# Patient Record
Sex: Female | Born: 1980 | Race: White | Hispanic: No | Marital: Single | Smoking: Never smoker
Health system: Southern US, Community
[De-identification: ages and names within clinical notes are randomized; demographics above are authoritative.]

## PROBLEM LIST (undated history)

## (undated) DIAGNOSIS — F319 Bipolar disorder, unspecified: Secondary | ICD-10-CM

## (undated) DIAGNOSIS — F419 Anxiety disorder, unspecified: Secondary | ICD-10-CM

## (undated) DIAGNOSIS — G629 Polyneuropathy, unspecified: Secondary | ICD-10-CM

## (undated) HISTORY — PX: TUBAL LIGATION: SHX77

## (undated) HISTORY — PX: ABLATION: SHX5711

---

## 2019-09-30 ENCOUNTER — Ambulatory Visit: Payer: Self-pay | Attending: Internal Medicine

## 2019-10-14 ENCOUNTER — Emergency Department (HOSPITAL_COMMUNITY): Payer: Medicaid - Out of State

## 2019-10-14 ENCOUNTER — Encounter (HOSPITAL_COMMUNITY): Payer: Self-pay | Admitting: Emergency Medicine

## 2019-10-14 ENCOUNTER — Other Ambulatory Visit: Payer: Self-pay

## 2019-10-14 ENCOUNTER — Emergency Department (HOSPITAL_COMMUNITY)
Admission: EM | Admit: 2019-10-14 | Discharge: 2019-10-14 | Disposition: A | Discharge status: Home / Self Care | Payer: Medicaid - Out of State | Attending: Emergency Medicine | Admitting: Emergency Medicine

## 2019-10-14 DIAGNOSIS — U071 COVID-19: Secondary | ICD-10-CM | POA: Diagnosis not present

## 2019-10-14 DIAGNOSIS — R110 Nausea: Secondary | ICD-10-CM | POA: Diagnosis not present

## 2019-10-14 DIAGNOSIS — R079 Chest pain, unspecified: Secondary | ICD-10-CM | POA: Diagnosis not present

## 2019-10-14 DIAGNOSIS — M791 Myalgia, unspecified site: Secondary | ICD-10-CM | POA: Diagnosis present

## 2019-10-14 HISTORY — DX: Bipolar disorder, unspecified: F31.9

## 2019-10-14 HISTORY — DX: Polyneuropathy, unspecified: G62.9

## 2019-10-14 HISTORY — DX: Anxiety disorder, unspecified: F41.9

## 2019-10-14 LAB — CBC WITH DIFFERENTIAL/PLATELET
Abs Immature Granulocytes: 0.03 10*3/uL (ref 0.00–0.07)
Basophils Absolute: 0 10*3/uL (ref 0.0–0.1)
Basophils Relative: 0 %
Eosinophils Absolute: 0.1 10*3/uL (ref 0.0–0.5)
Eosinophils Relative: 1 %
HCT: 43.2 % (ref 36.0–46.0)
Hemoglobin: 14.1 g/dL (ref 12.0–15.0)
Immature Granulocytes: 0 %
Lymphocytes Relative: 25 %
Lymphs Abs: 2.1 10*3/uL (ref 0.7–4.0)
MCH: 29.3 pg (ref 26.0–34.0)
MCHC: 32.6 g/dL (ref 30.0–36.0)
MCV: 89.8 fL (ref 80.0–100.0)
Monocytes Absolute: 0.5 10*3/uL (ref 0.1–1.0)
Monocytes Relative: 6 %
Neutro Abs: 5.7 10*3/uL (ref 1.7–7.7)
Neutrophils Relative %: 68 %
Platelets: 219 10*3/uL (ref 150–400)
RBC: 4.81 MIL/uL (ref 3.87–5.11)
RDW: 12.3 % (ref 11.5–15.5)
WBC: 8.4 10*3/uL (ref 4.0–10.5)
nRBC: 0 % (ref 0.0–0.2)

## 2019-10-14 LAB — COMPREHENSIVE METABOLIC PANEL
ALT: 16 U/L (ref 0–44)
AST: 19 U/L (ref 15–41)
Albumin: 3.5 g/dL (ref 3.5–5.0)
Alkaline Phosphatase: 70 U/L (ref 38–126)
Anion gap: 7 (ref 5–15)
BUN: 8 mg/dL (ref 6–20)
CO2: 25 mmol/L (ref 22–32)
Calcium: 8.5 mg/dL — ABNORMAL LOW (ref 8.9–10.3)
Chloride: 105 mmol/L (ref 98–111)
Creatinine, Ser: 0.43 mg/dL — ABNORMAL LOW (ref 0.44–1.00)
GFR calc Af Amer: >60 mL/min (ref >60)
GFR calc non Af Amer: >60 mL/min (ref >60)
Glucose, Bld: 97 mg/dL (ref 70–99)
Potassium: 3.8 mmol/L (ref 3.5–5.1)
Sodium: 137 mmol/L (ref 135–145)
Total Bilirubin: 0.4 mg/dL (ref 0.3–1.2)
Total Protein: 6.7 g/dL (ref 6.5–8.1)

## 2019-10-14 LAB — URINALYSIS, ROUTINE W REFLEX MICROSCOPIC
Bilirubin Urine: NEGATIVE
Glucose, UA: NEGATIVE mg/dL
Ketones, ur: NEGATIVE mg/dL
Nitrite: NEGATIVE
Protein, ur: NEGATIVE mg/dL
Specific Gravity, Urine: 1.012 (ref 1.005–1.030)
pH: 7 (ref 5.0–8.0)

## 2019-10-14 LAB — PREGNANCY, URINE: Preg Test, Ur: NEGATIVE

## 2019-10-14 LAB — TROPONIN I (HIGH SENSITIVITY): Troponin I (High Sensitivity): <2 ng/L (ref <18)

## 2019-10-14 LAB — HEPATITIS PANEL, ACUTE
HCV Ab: NONREACTIVE
Hep A IgM: NONREACTIVE
Hep B C IgM: NONREACTIVE
Hepatitis B Surface Ag: NONREACTIVE

## 2019-10-14 LAB — LIPASE, BLOOD: Lipase: 25 U/L (ref 11–51)

## 2019-10-14 LAB — HIV ANTIBODY (ROUTINE TESTING W REFLEX): HIV Screen 4th Generation wRfx: NONREACTIVE

## 2019-10-14 MED ORDER — ONDANSETRON HCL 4 MG PO TABS: 4.0000 mg | ORAL_TABLET | Freq: Three times a day (TID) | ORAL | 0 refills | Status: AC | PRN

## 2019-10-14 MED ORDER — PANTOPRAZOLE SODIUM 20 MG PO TBEC
20.0000 mg | DELAYED_RELEASE_TABLET | Freq: Every day | ORAL | 0 refills | Status: AC
Start: 2019-10-14 — End: ?

## 2019-10-14 MED ORDER — SODIUM CHLORIDE 0.9 % IV BOLUS
1000.0000 mL | Freq: Once | INTRAVENOUS | Status: AC
  Administered 2019-10-14: 1000 mL via INTRAVENOUS

## 2019-10-14 MED ORDER — ONDANSETRON HCL 4 MG/2ML IJ SOLN
4.0000 mg | Freq: Once | INTRAMUSCULAR | Status: AC
  Administered 2019-10-14: 4 mg via INTRAVENOUS
  Filled 2019-10-14: qty 2

## 2019-10-14 NOTE — ED Triage Notes (Addendum)
Pt reports was diagnosed with covid and oral thrush 5 days ago. Pt reports nausea, generalized weakness,body aches, emesis, intermittent chest pain. Pt reports had covid in January of 2021 as well. EDP at bedside during triage.

## 2019-10-14 NOTE — Discharge Instructions (Signed)
You were seen in the emergency department for worsening nausea chest pain and malaise in the setting of a new Covid diagnosis.  Your pulse ox was 100% here.  You had lab work that was unremarkable along with an EKG. your chest x-ray showed possible signs of pneumonia in the left base.  This could be due to Covid.  Please follow-up with your primary care doctor as scheduled.  We are prescribing you some nausea medication and acid medication.  Return the emergency department for any worsening or concerning symptoms.  At the time of discharge there are still labs pending which you can follow in my chart and review with your doctor.

## 2019-10-14 NOTE — ED Provider Notes (Signed)
[facility] EMERGENCY DEPARTMENT Provider Note   CSN: [id number] Arrival date & time: 10/14/19  1429     History Chief Complaint  Patient presents with  . Generalized Body Aches    Erica Klein is a 39 y.o. female.  She is here with complaint of being sick for 3 weeks.  She said she had Covid in January.  She ultimately went to an urgent care and they diagnosed her with thrush and Covid.  Using nystatin swish and swallows.  Complaining of severe nausea, generalized weakness, body aches, intermittent chest pain.  Minimal cough.  No diarrhea.  No prior history of thrush.  No recent antibiotics.  The history is provided by the patient.  Chest Pain Pain location:  Substernal area Pain quality: aching   Pain severity:  Moderate Onset quality:  Gradual Timing:  Intermittent Progression:  Worsening Chronicity:  New Relieved by:  None tried Worsened by:  Nothing Ineffective treatments:  None tried Associated symptoms: anorexia, cough, dysphagia, fatigue, headache, nausea, vomiting and weakness   Associated symptoms: no abdominal pain, no diaphoresis, no fever, no near-syncope, no numbness, no shortness of breath and no syncope   Risk factors: no coronary artery disease, no diabetes mellitus, no high cholesterol, no hypertension and no smoking        Past Medical History:  Diagnosis Date  . Anxiety   . Bipolar 1 disorder (HCC)   . Neuropathy     There are no problems to display for this patient.   Past Surgical History:  Procedure Laterality Date  . TUBAL LIGATION       OB History   No obstetric history on file.     History reviewed. No pertinent family history.  Social History   Tobacco Use  . Smoking status: Never Smoker  . Smokeless tobacco: Never Used  Substance Use Topics  . Alcohol use: Yes    Comment: occ  . Drug use: Yes    Types: Marijuana    Comment: occ    Home Medications Prior to Admission medications   Not on File    Allergies      Keflex [cephalexin] and Penicillins  Review of Systems   Review of Systems  Constitutional: Positive for fatigue. Negative for diaphoresis and fever.  HENT: Positive for trouble swallowing. Negative for sore throat.   Eyes: Negative for visual disturbance.  Respiratory: Positive for cough. Negative for shortness of breath.   Cardiovascular: Positive for chest pain. Negative for syncope and near-syncope.  Gastrointestinal: Positive for anorexia, nausea and vomiting. Negative for abdominal pain.  Genitourinary: Negative for dysuria.  Musculoskeletal: Positive for myalgias.  Skin: Negative for rash.  Neurological: Positive for weakness and headaches. Negative for numbness.    Physical Exam Updated Vital Signs BP (!) 154/88 (BP Location: Left Arm)   Pulse 90   Resp 18   Ht 5\' 10"  (1.778 m)   Wt 88.5 kg   LMP  (LMP Unknown) Comment: irregular periods  SpO2 99%   BMI 27.98 kg/m   Physical Exam Vitals and nursing note reviewed.  Constitutional:      General: She is not in acute distress.    Appearance: She is well-developed.  HENT:     Head: Normocephalic and atraumatic.     Mouth/Throat:     Mouth: Mucous membranes are dry.     Pharynx: No oropharyngeal exudate.     Comments: She has somewhat of a beefy red tongue that is dry in appearance.  Not  see any pharyngeal plaques. Eyes:     Conjunctiva/sclera: Conjunctivae normal.  Cardiovascular:     Rate and Rhythm: Normal rate and regular rhythm.     Pulses: Normal pulses.     Heart sounds: No murmur heard.   Pulmonary:     Effort: Pulmonary effort is normal. No respiratory distress.     Breath sounds: Normal breath sounds.  Abdominal:     Palpations: Abdomen is soft.     Tenderness: There is no abdominal tenderness. There is no guarding or rebound.  Musculoskeletal:        General: No deformity or signs of injury. Normal range of motion.     Cervical back: Neck supple.  Skin:    General: Skin is warm and dry.      Capillary Refill: Capillary refill takes less than 2 seconds.  Neurological:     General: No focal deficit present.     Mental Status: She is alert.     Gait: Gait normal.     ED Results / Procedures / Treatments   Labs (all labs ordered are listed, but only abnormal results are displayed) Labs Reviewed  URINALYSIS, ROUTINE W REFLEX MICROSCOPIC - Abnormal; Notable for the following components:      Result Value   APPearance HAZY (*)    Hgb urine dipstick MODERATE (*)    Leukocytes,Ua MODERATE (*)    Bacteria, UA RARE (*)    All other components within normal limits  COMPREHENSIVE METABOLIC PANEL - Abnormal; Notable for the following components:   Creatinine, Ser 0.43 (*)    Calcium 8.5 (*)    All other components within normal limits  CULTURE, BLOOD (ROUTINE X 2)  CULTURE, BLOOD (ROUTINE X 2)  URINE CULTURE  PREGNANCY, URINE  CBC WITH DIFFERENTIAL/PLATELET  LIPASE, BLOOD  HIV ANTIBODY (ROUTINE TESTING W REFLEX)  HEPATITIS PANEL, ACUTE  TROPONIN I (HIGH SENSITIVITY)    EKG EKG Interpretation  Date/Time:  Wednesday October 14 2019 16:38:01 EDT Ventricular Rate:  76 PR Interval:    QRS Duration: 88 QT Interval:  412 QTC Calculation: 464 R Axis:   76 Text Interpretation: Sinus rhythm No old tracing to compare Confirmed by Meridee Score [phone]) on 10/14/2019 4:54:56 PM   Radiology DG Chest Port 1 View  Result Date: 10/14/2019 CLINICAL DATA:  Chest pain.  COVID 5 days ago. EXAM: PORTABLE CHEST 1 VIEW COMPARISON:  None. FINDINGS: Mild hyperinflation and central bronchial thickening. The heart is normal in size. Normal mediastinal contours. Minimal subsegmental opacities at the left lung base. No confluent airspace disease. Mild biapical pleuroparenchymal scarring. No pneumothorax or large pleural effusion. No acute osseous abnormalities are seen. IMPRESSION: Mild hyperinflation and central bronchial thickening, can be seen with asthma or bronchitis. Minimal subsegmental  opacities at the left lung base, atelectasis versus pneumonia in the setting of COVID infection. Electronically Signed   By: Narda Rutherford M.D.   On: 10/14/2019 16:12    Procedures Procedures (including critical care time)  Medications Ordered in ED Medications  sodium chloride 0.9 % bolus 1,000 mL (0 mLs Intravenous Stopped 10/14/19 1902)  ondansetron (ZOFRAN) injection 4 mg (4 mg Intravenous Given 10/14/19 1632)    ED Course  I have reviewed the triage vital signs and the nursing notes.  Pertinent labs & imaging results that were available during my care of the patient were reviewed by me and considered in my medical decision making (see chart for details).  Clinical Course as of Oct 15 1051  Wed Oct 14, 2019  1558 Chest x-ray interpreted by me as flattened diaphragms no gross infiltrates.  Awaiting radiology reading.   [MB]  1731 Reevaluated patient, she says her nausea is much improved.   [MB]  1818 Patient's lab work is been fairly unremarkable.  I do not think she needs a second troponin because her symptoms are very atypical and her troponin is undetectable.  I reviewed all this with her.  She is comfortable with the plan for following up with her primary care doctor.  I will prescribe her some nausea medicine and a PPI.  Return instructions discussed.   [MB]    Clinical Course User Index [MB] Terrilee Files, MD   MDM Rules/Calculators/A&P                         HANAE WAITERS was evaluated in Emergency Department on 10/14/2019 for the symptoms described in the history of present illness. She was evaluated in the context of the global COVID-19 pandemic, which necessitated consideration that the patient might be at risk for infection with the SARS-CoV-2 virus that causes COVID-19. Institutional protocols and algorithms that pertain to the evaluation of patients at risk for COVID-19 are in a state of rapid change based on information released by regulatory bodies including the  CDC and federal and state organizations. These policies and algorithms were followed during the patient's care in the ED.  This patient complains of weakness nausea chest pain Covid; this involves an extensive number of treatment Options and is a complaint that carries with it a high risk of complications and Morbidity. The differential includes Covid, pneumonia, dehydration, infection, metabolic derangement  I ordered, reviewed and interpreted labs, which included CBC with normal white count normal hemoglobin, chemistries normal other than mildly low calcium of 8.5 urinalysis without overt signs of infection, troponin negative, pregnancy test negativeChemistries with elevated glucose, normal LFTs. Have ordered HIV and hepatitis testing and these are pending at time of discharge I ordered medication IV fluids and nausea medication with improvement in her symptoms I ordered imaging studies which included chest x-ray and I independently    visualized and interpreted imaging which showed possible atelectasis versus infiltrate left base Previous records obtained and reviewed in epic, none  After the interventions stated above, I reevaluated the patient and found her to be hemodynamically stable satting 99% on room air. Symptoms are improved. Reviewed work-up with her. She is comfortable with discharge and following up with her PCP. Will write her a prescription for Zofran. Return instructions discussed   Final Clinical Impression(s) / ED Diagnoses Final diagnoses:  COVID-19 virus infection  Nausea  Nonspecific chest pain    Rx / DC Orders ED Discharge Orders         Ordered    ondansetron (ZOFRAN) 4 MG tablet  Every 8 hours PRN     Discontinue  Reprint     10/14/19 1819    pantoprazole (PROTONIX) 20 MG tablet  Daily     Discontinue  Reprint     10/14/19 1819           Terrilee Files, MD 10/15/19 1056

## 2019-10-15 LAB — URINE CULTURE: Culture: <10000 — AB

## 2019-10-19 LAB — CULTURE, BLOOD (ROUTINE X 2)
Culture: NO GROWTH
Culture: NO GROWTH
Special Requests: ADEQUATE
Special Requests: ADEQUATE

## 2020-04-08 ENCOUNTER — Emergency Department (HOSPITAL_COMMUNITY)
Admission: EM | Admit: 2020-04-08 | Discharge: 2020-04-09 | Disposition: A | Discharge status: Home / Self Care | Payer: Medicaid - Out of State | Attending: Emergency Medicine | Admitting: Emergency Medicine

## 2020-04-08 ENCOUNTER — Other Ambulatory Visit: Payer: Self-pay

## 2020-04-08 ENCOUNTER — Encounter (HOSPITAL_COMMUNITY): Payer: Self-pay | Admitting: Emergency Medicine

## 2020-04-08 DIAGNOSIS — M79605 Pain in left leg: Secondary | ICD-10-CM | POA: Diagnosis not present

## 2020-04-08 LAB — CBC WITH DIFFERENTIAL/PLATELET
Abs Immature Granulocytes: 0.03 10*3/uL (ref 0.00–0.07)
Basophils Absolute: 0 10*3/uL (ref 0.0–0.1)
Basophils Relative: 0 %
Eosinophils Absolute: 0.1 10*3/uL (ref 0.0–0.5)
Eosinophils Relative: 2 %
HCT: 37.5 % (ref 36.0–46.0)
Hemoglobin: 12 g/dL (ref 12.0–15.0)
Immature Granulocytes: 0 %
Lymphocytes Relative: 27 %
Lymphs Abs: 2.3 10*3/uL (ref 0.7–4.0)
MCH: 29.3 pg (ref 26.0–34.0)
MCHC: 32 g/dL (ref 30.0–36.0)
MCV: 91.7 fL (ref 80.0–100.0)
Monocytes Absolute: 0.5 10*3/uL (ref 0.1–1.0)
Monocytes Relative: 6 %
Neutro Abs: 5.5 10*3/uL (ref 1.7–7.7)
Neutrophils Relative %: 65 %
Platelets: 202 10*3/uL (ref 150–400)
RBC: 4.09 MIL/uL (ref 3.87–5.11)
RDW: 12.5 % (ref 11.5–15.5)
WBC: 8.5 10*3/uL (ref 4.0–10.5)
nRBC: 0 % (ref 0.0–0.2)

## 2020-04-08 LAB — BASIC METABOLIC PANEL
Anion gap: 10 (ref 5–15)
BUN: 9 mg/dL (ref 6–20)
CO2: 21 mmol/L — ABNORMAL LOW (ref 22–32)
Calcium: 8.7 mg/dL — ABNORMAL LOW (ref 8.9–10.3)
Chloride: 105 mmol/L (ref 98–111)
Creatinine, Ser: 0.56 mg/dL (ref 0.44–1.00)
GFR, Estimated: >60 mL/min (ref >60)
Glucose, Bld: 92 mg/dL (ref 70–99)
Potassium: 3.4 mmol/L — ABNORMAL LOW (ref 3.5–5.1)
Sodium: 136 mmol/L (ref 135–145)

## 2020-04-08 LAB — PREGNANCY, URINE: Preg Test, Ur: NEGATIVE

## 2020-04-08 MED ORDER — ENOXAPARIN SODIUM 100 MG/ML ~~LOC~~ SOLN
1.0000 mg/kg | Freq: Once | SUBCUTANEOUS | Status: AC
  Administered 2020-04-09: 90 mg via SUBCUTANEOUS
  Filled 2020-04-08: qty 1

## 2020-04-08 NOTE — ED Triage Notes (Signed)
Patient reports pain in LT calf that started a couple of hours ago, worried it may be a blood clot.

## 2020-04-08 NOTE — Discharge Instructions (Signed)
Please return for lower extremity ultrasound tomorrow morning at 9 AM. Show up no later than 8:45 AM for registration.

## 2020-04-08 NOTE — ED Notes (Signed)
Pt. Had a steady gait to room.

## 2020-04-08 NOTE — ED Provider Notes (Signed)
[facility] EMERGENCY DEPARTMENT Provider Note   CSN: [id number] Arrival date & time: 04/08/20  2104     History Chief Complaint  Patient presents with  . Leg Pain    Erica Klein is a 39 y.o. female who presents to the ED today with complaint of gradual onset, constant, throbbing, left calf pain that began approximately 2 hours ago. Pt reports that multiple individual on her maternal side of had blood clots - she is unsure if there is a blood disorder in the family prompting them. She states that her boyfriend also noticed that her leg was more swollen to him and she agrees it does look slightly more swollen. She has not taken anything for pain. Denies chest pain or SOB. No hx DVT/PE personally for patient. No recent trauma to the leg. No recent surgery or prolonged immobilization. Pt is not on any OCPs. Pt denies chance of pregnancy.  The history is provided by the patient and medical records.       Past Medical History:  Diagnosis Date  . Anxiety   . Bipolar 1 disorder (HCC)   . Neuropathy     There are no problems to display for this patient.   Past Surgical History:  Procedure Laterality Date  . TUBAL LIGATION       OB History   No obstetric history on file.     No family history on file.  Social History   Tobacco Use  . Smoking status: Never Smoker  . Smokeless tobacco: Never Used  Substance Use Topics  . Alcohol use: Yes    Comment: occ  . Drug use: Yes    Types: Marijuana    Comment: occ    Home Medications Prior to Admission medications   Medication Sig Start Date End Date Taking? Authorizing Provider  albuterol (VENTOLIN HFA) 108 (90 Base) MCG/ACT inhaler Inhale 2 puffs into the lungs every 4 (four) hours as needed for wheezing or shortness of breath.  04/08/19   [provider]  clonazePAM (KLONOPIN) 0.5 MG tablet Take 0.5 mg by mouth daily as needed. 07/23/19   [provider]  gabapentin (NEURONTIN) 300 MG capsule Take  300 mg by mouth 3 (three) times daily. 09/28/19   [provider]  nystatin (MYCOSTATIN) 100000 UNIT/ML suspension Take 1 mL by mouth 2 (two) times daily. 10/09/19   [provider]  ondansetron (ZOFRAN) 4 MG tablet Take 1 tablet (4 mg total) by mouth every 8 (eight) hours as needed for nausea or vomiting. 10/14/19   Terrilee Files, MD  pantoprazole (PROTONIX) 20 MG tablet Take 1 tablet (20 mg total) by mouth daily. 10/14/19   Terrilee Files, MD  VRAYLAR capsule Take 1.5 mg by mouth daily. 09/26/19   [provider]    Allergies    Keflex [cephalexin] and Penicillins  Review of Systems   Review of Systems  Constitutional: Negative for chills and fever.  Respiratory: Negative for shortness of breath.   Cardiovascular: Positive for leg swelling. Negative for chest pain.  Musculoskeletal: Positive for arthralgias (left calf pain).    Physical Exam Updated Vital Signs BP 131/80 (BP Location: Right Arm)   Temp 98.3 F (36.8 C) (Oral)   Resp 18   Ht 5\' 11"  (1.803 m)   Wt 90.7 kg   SpO2 100%   BMI 27.89 kg/m   Physical Exam Vitals and nursing note reviewed.  Constitutional:      Appearance: She is not ill-appearing.  HENT:     Head: Normocephalic and atraumatic.  Eyes:     Conjunctiva/sclera: Conjunctivae normal.  Cardiovascular:     Rate and Rhythm: Normal rate and regular rhythm.     Pulses: Normal pulses.  Pulmonary:     Effort: Pulmonary effort is normal.     Breath sounds: Normal breath sounds. No wheezing, rhonchi or rales.  Musculoskeletal:     Comments: Mild swelling noted to LLE compared to RLE with TTP along the posterior calf and popliteal area. No baker's cyst appreciated. No erythema. 2+ DP pulse. ROM intact to LLE. Strength and sensation intact.   Skin:    General: Skin is warm and dry.     Coloration: Skin is not jaundiced.  Neurological:     Mental Status: She is alert.     ED Results / Procedures / Treatments   Labs (all labs  ordered are listed, but only abnormal results are displayed) Labs Reviewed  BASIC METABOLIC PANEL - Abnormal; Notable for the following components:      Result Value   Potassium 3.4 (*)    CO2 21 (*)    Calcium 8.7 (*)    All other components within normal limits  CBC WITH DIFFERENTIAL/PLATELET  PREGNANCY, URINE    EKG None  Radiology No results found.  Procedures Procedures (including critical care time)  Medications Ordered in ED Medications - No data to display  ED Course  I have reviewed the triage vital signs and the nursing notes.  Pertinent labs & imaging results that were available during my care of the patient were reviewed by me and considered in my medical decision making (see chart for details).    MDM Rules/Calculators/A&P                          39 year old female presents the ED today with complaint of left lower leg swelling and pain to her calf for the past few hours.  Reports history of multiple individuals in her family having blood clots for unknown unprovoked reasons.  She denies any chest pain or shortness of breath and has no history of DVT/PE personally.  No risk factors at this time.  On exam left lower extremity does appear mildly more swollen compared to the right with tenderness along the deep venous system of the posterior calf and popliteal area.  No trauma or fall to suggest musculoskeletal type pain.  No feel she needs an x-ray at this time.  Unfortunately we do not have vascular ultrasound here at this time.  We will plan to have patient return in the morning for same.  Given high likelihood of blood clot will obtain lab work at this time and provide dose of Lovenox prior to discharge tonight.  Have discussed with radiology department who has scheduled her for 9 AM tomorrow morning.  Patient updated.]  CBC without leukocytosis.  BMP with stable creatinine 0.56.   At shift change case signed out to oncoming ED physician Dr. Bebe Shaggy who will  dispo patient after urine preg. If neg pt can get a dose of Lovenox here tonight and return in the AM for ultrasound.   This note was prepared using Dragon voice recognition software and may include unintentional dictation errors due to the inherent limitations of voice recognition software.   Final Clinical Impression(s) / ED Diagnoses Final diagnoses:  Left leg pain    Rx / DC Orders ED Discharge Orders  Ordered    US Venous Img Lower Unilateral Left        04/08/20 2151           Tanda Rockers, PA-C 04/08/20 2311    Zadie Rhine, MD 04/08/20 2352

## 2020-04-09 ENCOUNTER — Emergency Department (HOSPITAL_COMMUNITY)
Admit: 2020-04-09 | Discharge: 2020-04-09 | Disposition: A | Discharge status: Home / Self Care | Payer: Medicaid - Out of State | Attending: Emergency Medicine | Admitting: Emergency Medicine

## 2020-04-16 ENCOUNTER — Encounter (HOSPITAL_COMMUNITY): Payer: Self-pay | Admitting: Emergency Medicine

## 2020-04-16 ENCOUNTER — Emergency Department (HOSPITAL_COMMUNITY)
Admission: EM | Admit: 2020-04-16 | Discharge: 2020-04-16 | Disposition: A | Discharge status: Home / Self Care | Payer: Medicaid - Out of State | Attending: Emergency Medicine | Admitting: Emergency Medicine

## 2020-04-16 ENCOUNTER — Other Ambulatory Visit: Payer: Self-pay

## 2020-04-16 DIAGNOSIS — N390 Urinary tract infection, site not specified: Secondary | ICD-10-CM | POA: Diagnosis present

## 2020-04-16 DIAGNOSIS — N3090 Cystitis, unspecified without hematuria: Secondary | ICD-10-CM

## 2020-04-16 LAB — PREGNANCY, URINE: Preg Test, Ur: NEGATIVE

## 2020-04-16 LAB — URINALYSIS, ROUTINE W REFLEX MICROSCOPIC
Bilirubin Urine: NEGATIVE
Glucose, UA: NEGATIVE mg/dL
Ketones, ur: NEGATIVE mg/dL
Nitrite: POSITIVE — AB
Protein, ur: 100 mg/dL — AB
Specific Gravity, Urine: 1.015 (ref 1.005–1.030)
WBC, UA: >50 WBC/hpf — ABNORMAL HIGH (ref 0–5)
pH: 6 (ref 5.0–8.0)

## 2020-04-16 MED ORDER — PHENAZOPYRIDINE HCL 200 MG PO TABS: 200.0000 mg | ORAL_TABLET | Freq: Three times a day (TID) | ORAL | 0 refills | Status: AC

## 2020-04-16 MED ORDER — NITROFURANTOIN MONOHYD MACRO 100 MG PO CAPS: 100.0000 mg | ORAL_CAPSULE | Freq: Two times a day (BID) | ORAL | 0 refills | Status: AC

## 2020-04-16 NOTE — ED Triage Notes (Signed)
Patient c/o dysuria with dark urine with foul odor x3 days. Patient reports started running a low grade fever (99.5) yesterday and has had nausea with dry heaves.

## 2020-04-16 NOTE — ED Notes (Signed)
Pt reports 3 days hx of urinary sx  Tried to call PCP 'no appts"  Tried to go to [facility], "they were backed up"   Here for eval   Spec to lab

## 2020-04-16 NOTE — ED Notes (Signed)
Call to lab  Re: culture   POC: [place]

## 2020-04-16 NOTE — ED Provider Notes (Signed)
[facility] EMERGENCY DEPARTMENT Provider Note   CSN: [id number] Arrival date & time: 04/16/20  1249     History Chief Complaint  Patient presents with  . Dysuria    Erica Klein is a 40 y.o. female presents to the ED for concern of UTI.  Reports burning with urination, increased frequency, urgency for the last 3 days.  Yesterday she had some nausea and dry heaving but no vomiting.  Reports lower suprapubic abdominal pain "cramping" that is worse with urination.  The pain was worse yesterday and has actually improved today.  She tried to flush it out with cranberry juice and water but symptoms persisted this morning.  Tried to see her primary care doctor but they could not see her today.  States urgent care was also full and did not have any appointments.  History of UTIs in the past that felt similar.  History of kidney stone but states this does not feel like a kidney stone she has no flank or back pain.  She just finished her menstrual period recently.  Denies abnormal vaginal bleeding or discharge.  No modifying factors.  HPI     Past Medical History:  Diagnosis Date  . Anxiety   . Bipolar 1 disorder (HCC)   . Neuropathy     There are no problems to display for this patient.   Past Surgical History:  Procedure Laterality Date  . ABLATION    . TUBAL LIGATION       OB History    Gravida  3   Para  3   Term  3   Preterm      AB      Living  3     SAB      IAB      Ectopic      Multiple      Live Births              History reviewed. No pertinent family history.  Social History   Tobacco Use  . Smoking status: Never Smoker  . Smokeless tobacco: Never Used  Vaping Use  . Vaping Use: Never used  Substance Use Topics  . Alcohol use: Yes    Comment: occ  . Drug use: Yes    Types: Marijuana    Comment: occ    Home Medications Prior to Admission medications   Medication Sig Start Date End Date Taking? Authorizing Provider   nitrofurantoin, macrocrystal-monohydrate, (MACROBID) 100 MG capsule Take 1 capsule (100 mg total) by mouth 2 (two) times daily. 04/16/20  Yes Liberty Handy, PA-C  phenazopyridine (PYRIDIUM) 200 MG tablet Take 1 tablet (200 mg total) by mouth 3 (three) times daily. 04/16/20  Yes Liberty Handy, PA-C  albuterol (VENTOLIN HFA) 108 (90 Base) MCG/ACT inhaler Inhale 2 puffs into the lungs every 4 (four) hours as needed for wheezing or shortness of breath.  04/08/19   [provider]  clonazePAM (KLONOPIN) 0.5 MG tablet Take 0.5 mg by mouth daily as needed. 07/23/19   [provider]  gabapentin (NEURONTIN) 300 MG capsule Take 300 mg by mouth 3 (three) times daily. 09/28/19   [provider]  nystatin (MYCOSTATIN) 100000 UNIT/ML suspension Take 1 mL by mouth 2 (two) times daily. 10/09/19   [provider]  ondansetron (ZOFRAN) 4 MG tablet Take 1 tablet (4 mg total) by mouth every 8 (eight) hours as needed for nausea or vomiting. 10/14/19   Terrilee Files, MD  pantoprazole (PROTONIX)  20 MG tablet Take 1 tablet (20 mg total) by mouth daily. 10/14/19   Terrilee Files, MD  VRAYLAR capsule Take 1.5 mg by mouth daily. 09/26/19   [provider]    Allergies    Keflex [cephalexin] and Penicillins  Review of Systems   Review of Systems  Gastrointestinal: Positive for abdominal pain.  Genitourinary: Positive for difficulty urinating, dysuria, frequency and urgency.  All other systems reviewed and are negative.   Physical Exam Updated Vital Signs BP 129/70 (BP Location: Right Arm)   Pulse 74   Temp 97.9 F (36.6 C) (Oral)   Resp 18   Ht 5\' 10"  (1.778 m)   Wt 90.7 kg   SpO2 100%   BMI 28.70 kg/m   Physical Exam Vitals and nursing note reviewed.  Constitutional:      Appearance: She is well-developed.     Comments: Non toxic in NAD  HENT:     Head: Normocephalic and atraumatic.     Nose: Nose normal.  Eyes:     Conjunctiva/sclera: Conjunctivae  normal.  Cardiovascular:     Rate and Rhythm: Normal rate and regular rhythm.  Pulmonary:     Effort: Pulmonary effort is normal.     Breath sounds: Normal breath sounds.  Abdominal:     General: Bowel sounds are normal.     Palpations: Abdomen is soft.     Tenderness: There is abdominal tenderness (suprapubic).     Comments: No G/R/R. No CVA tenderness. Negative Murphy's and McBurney's. Active BS to lower quadrants.   Musculoskeletal:        General: Normal range of motion.     Cervical back: Normal range of motion.  Skin:    General: Skin is warm and dry.     Capillary Refill: Capillary refill takes less than 2 seconds.  Neurological:     Mental Status: She is alert.  Psychiatric:        Behavior: Behavior normal.     ED Results / Procedures / Treatments   Labs (all labs ordered are listed, but only abnormal results are displayed) Labs Reviewed  URINALYSIS, ROUTINE W REFLEX MICROSCOPIC - Abnormal; Notable for the following components:      Result Value   APPearance CLOUDY (*)    Hgb urine dipstick MODERATE (*)    Protein, ur 100 (*)    Nitrite POSITIVE (*)    Leukocytes,Ua LARGE (*)    WBC, UA >50 (*)    Bacteria, UA MANY (*)    Non Squamous Epithelial 0-5 (*)    All other components within normal limits  URINE CULTURE  PREGNANCY, URINE    EKG None  Radiology No results found.  Procedures Procedures (including critical care time)  Medications Ordered in ED Medications - No data to display  ED Course  I have reviewed the triage vital signs and the nursing notes.  Pertinent labs & imaging results that were available during my care of the patient were reviewed by me and considered in my medical decision making (see chart for details).  Clinical Course as of 04/16/20 1548  Sat Apr 16, 2020  1545 Nitrite(!): POSITIVE [CG]  1545 Leukocytes,Ua(!): LARGE [CG]  1545 WBC, UA(!): >50 [CG]  1545 Bacteria, UA(!): MANY [CG]    Clinical Course User Index [CG]  Apr 18, 2020, PA-C   MDM Rules/Calculators/A&P  EMR, triage nurse notes reviewed  40 year old female presents to the ED with UTIS.    No red flags like CVA tenderness, fever.  Urinalysis and urine pregnancy ordered  ER work-up personally visualized and interpreted  Urinalysis consistent with UTI.  Patient reevaluated and no clinical decline.  Discussed results of urinalysis.  We will discharge with Macrobid as she has allergies to Keflex, Pyridium.  Recommended oral hydration.  Return precautions discussed.  Patient is comfortable with this plan. Final Clinical Impression(s) / ED Diagnoses Final diagnoses:  Cystitis    Rx / DC Orders ED Discharge Orders         Ordered    nitrofurantoin, macrocrystal-monohydrate, (MACROBID) 100 MG capsule  2 times daily        04/16/20 1548    phenazopyridine (PYRIDIUM) 200 MG tablet  3 times daily        04/16/20 1548           Jerrell Mylar 04/16/20 1551    Jacalyn Lefevre, MD 04/17/20 [phone]

## 2020-04-16 NOTE — Discharge Instructions (Addendum)
You were seen in the emergency department for urinary symptoms  Urinalysis confirms an infection in your urine  Take Macrobid as prescribed.  Pyridium will help with the bladder spasms and pain.  Pyridium will cause changes in the color of your urine to yellow/orange, this will resolve after you are done with the medicine.  You may take ibuprofen or acetaminophen as needed for pain.  Stay hydrated.  Drink at least 2 L of water daily  Return to the ED for worsening symptoms, fever greater than 100, radiating pain into your flank or back

## 2020-04-19 LAB — URINE CULTURE: Culture: >=100000 — AB

## 2020-04-20 ENCOUNTER — Telehealth: Payer: Self-pay | Admitting: *Deleted

## 2020-04-20 NOTE — Telephone Encounter (Signed)
Post ED Visit - Positive Culture Follow-up  Culture report reviewed by antimicrobial stewardship pharmacist: [facility] []  , Pharm.D. []  Enzo Bi, Pharm.D., BCPS AQ-ID []  , Pharm.D., BCPS []  Celedonio Miyamoto, Pharm.D., BCPS []  [place], Garvin Fila.D., BCPS, AAHIVP []  , Pharm.D., BCPS, AAHIVP []  Georgina Pillion, PharmD, BCPS []  , PharmD, BCPS []  [place], PharmD, BCPS []  [address], PharmD [x]  , PharmD, BCPS []  Estella Husk, PharmD  Pharmacy Team []  Lysle Pearl, PharmD []  , PharmD []  Phillips Climes, PharmD []  , Rph []  Agapito Games) , PharmD []  Verlan Friends, PharmD []  , PharmD []  Mervyn Gay, PharmD []  , PharmD []  Vinnie Level, PharmD []  [facility], PharmD []  , PharmD []  Len Childs, PharmD   Positive urine culture Treated with Nitrofurantoin Monohyd Macro, organism sensitive to the same and no further patient follow-up is required at this time.    [facility] 04/20/2020, 1:04 PM

## 2020-04-26 ENCOUNTER — Emergency Department (HOSPITAL_COMMUNITY)
Admission: EM | Admit: 2020-04-26 | Discharge: 2020-04-26 | Disposition: A | Discharge status: Other Acute Inpatient Hospital | Payer: Medicaid - Out of State

## 2020-07-11 ENCOUNTER — Emergency Department (HOSPITAL_COMMUNITY): Payer: Medicaid - Out of State

## 2020-07-11 ENCOUNTER — Other Ambulatory Visit: Payer: Self-pay

## 2020-07-11 ENCOUNTER — Encounter (HOSPITAL_COMMUNITY): Payer: Self-pay | Admitting: *Deleted

## 2020-07-11 ENCOUNTER — Emergency Department (HOSPITAL_COMMUNITY)
Admission: EM | Admit: 2020-07-11 | Discharge: 2020-07-11 | Disposition: A | Discharge status: Home / Self Care | Payer: Medicaid - Out of State | Attending: Emergency Medicine | Admitting: Emergency Medicine

## 2020-07-11 DIAGNOSIS — R0781 Pleurodynia: Secondary | ICD-10-CM | POA: Insufficient documentation

## 2020-07-11 DIAGNOSIS — R0602 Shortness of breath: Secondary | ICD-10-CM | POA: Insufficient documentation

## 2020-07-11 DIAGNOSIS — M549 Dorsalgia, unspecified: Secondary | ICD-10-CM | POA: Insufficient documentation

## 2020-07-11 DIAGNOSIS — R0789 Other chest pain: Secondary | ICD-10-CM

## 2020-07-11 DIAGNOSIS — Z859 Personal history of malignant neoplasm, unspecified: Secondary | ICD-10-CM | POA: Insufficient documentation

## 2020-07-11 LAB — CBC
HCT: 40.7 % (ref 36.0–46.0)
Hemoglobin: 13 g/dL (ref 12.0–15.0)
MCH: 28.3 pg (ref 26.0–34.0)
MCHC: 31.9 g/dL (ref 30.0–36.0)
MCV: 88.7 fL (ref 80.0–100.0)
Platelets: 225 10*3/uL (ref 150–400)
RBC: 4.59 MIL/uL (ref 3.87–5.11)
RDW: 12.8 % (ref 11.5–15.5)
WBC: 7.6 10*3/uL (ref 4.0–10.5)
nRBC: 0 % (ref 0.0–0.2)

## 2020-07-11 LAB — D-DIMER, QUANTITATIVE: D-Dimer, Quant: <0.27 ug/mL-FEU (ref 0.00–0.50)

## 2020-07-11 LAB — BASIC METABOLIC PANEL
Anion gap: 9 (ref 5–15)
BUN: 13 mg/dL (ref 6–20)
CO2: 27 mmol/L (ref 22–32)
Calcium: 9 mg/dL (ref 8.9–10.3)
Chloride: 105 mmol/L (ref 98–111)
Creatinine, Ser: 0.83 mg/dL (ref 0.44–1.00)
GFR, Estimated: >60 mL/min (ref >60)
Glucose, Bld: 94 mg/dL (ref 70–99)
Potassium: 3.9 mmol/L (ref 3.5–5.1)
Sodium: 141 mmol/L (ref 135–145)

## 2020-07-11 LAB — POC URINE PREG, ED: Preg Test, Ur: NEGATIVE

## 2020-07-11 LAB — TROPONIN I (HIGH SENSITIVITY)
Troponin I (High Sensitivity): 3 ng/L (ref <18)
Troponin I (High Sensitivity): 2 ng/L (ref <18)

## 2020-07-11 MED ORDER — HYDROCODONE-ACETAMINOPHEN 5-325 MG PO TABS
2.0000 | ORAL_TABLET | Freq: Once | ORAL | Status: AC
  Administered 2020-07-11: 2 via ORAL
  Filled 2020-07-11: qty 2
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
## 2020-07-11 NOTE — ED Triage Notes (Signed)
Chest pain onset last night °

## 2020-07-11 NOTE — Discharge Instructions (Addendum)
Follow up with your Physician for recheck  
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
## 2020-07-11 NOTE — ED Provider Notes (Signed)
Pt's care assumed by me at 9pm.   Pt request medication for pain.  Pt given hydrocodone 2 tablets. dDimer is normal.  Troponin is negative.   Vitals normal,   Pt advised to follow up with her MD for recheck.    Osie Cheeks 07/11/20 2300    Jacalyn Lefevre, MD 07/12/20 2227

## 2021-06-27 IMAGING — US US EXTREM LOW VENOUS*L*
1 series · 13 of 24 positions shown · non-contrast
Comparison: None.

CLINICAL DATA: Left calf pain and edema.



[Series 1: us venous img lower uni left (dvt) · portal-venous · 13 of 36 slices shown]
[im 1/36]
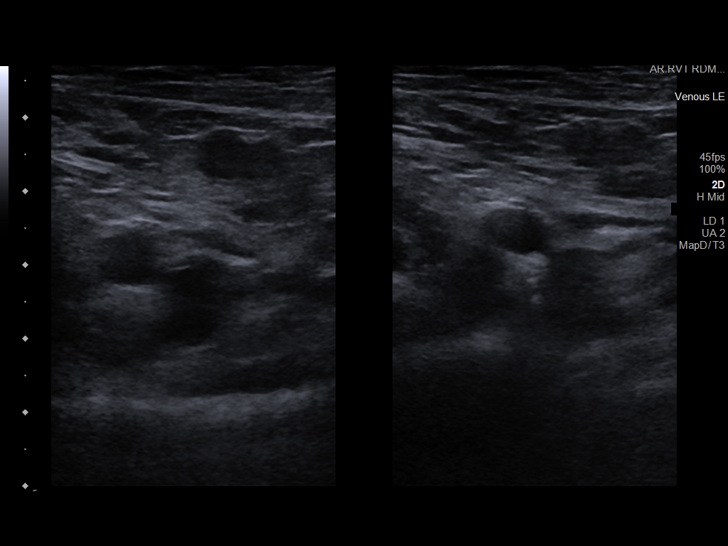
[im 4/36]
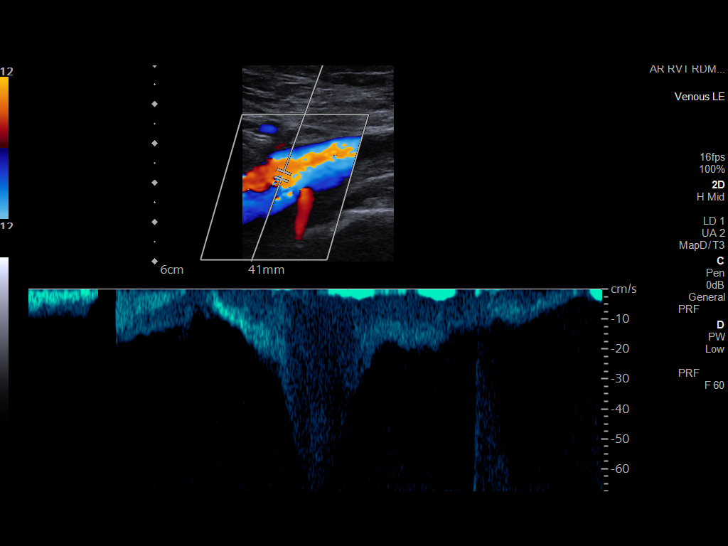
[im 7/36]
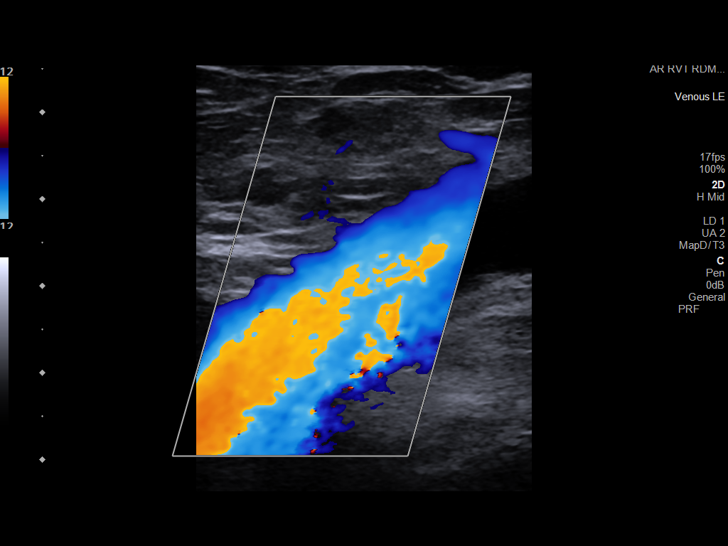
[im 10/36]
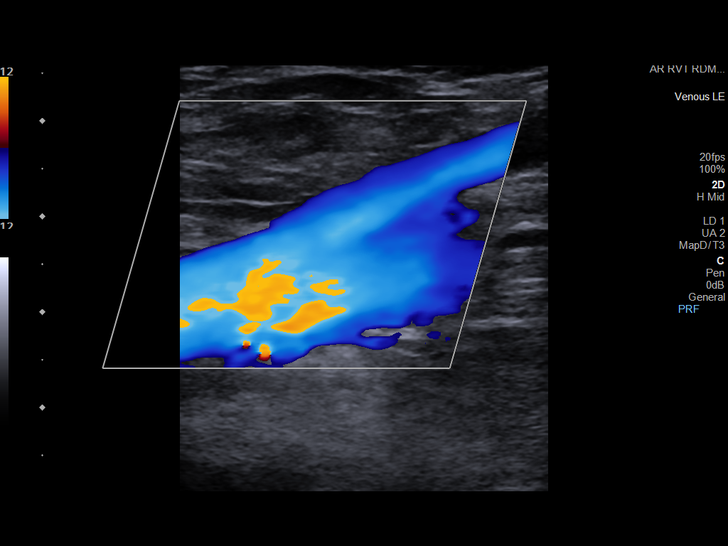
[im 13/36]
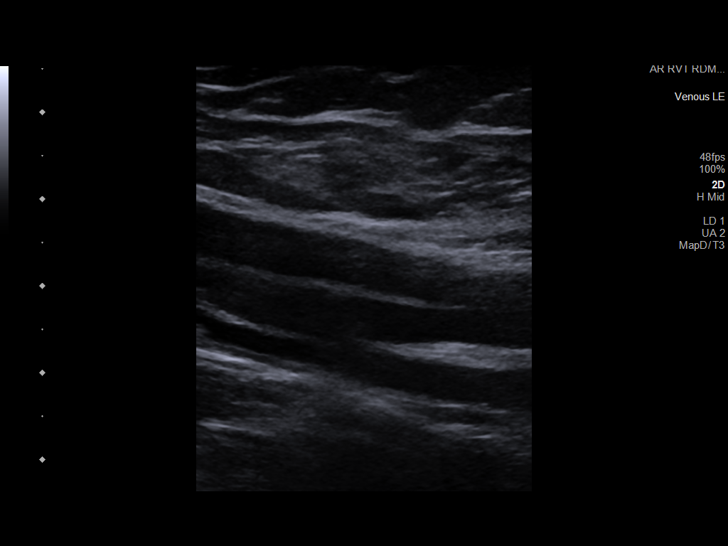
[im 16/36]
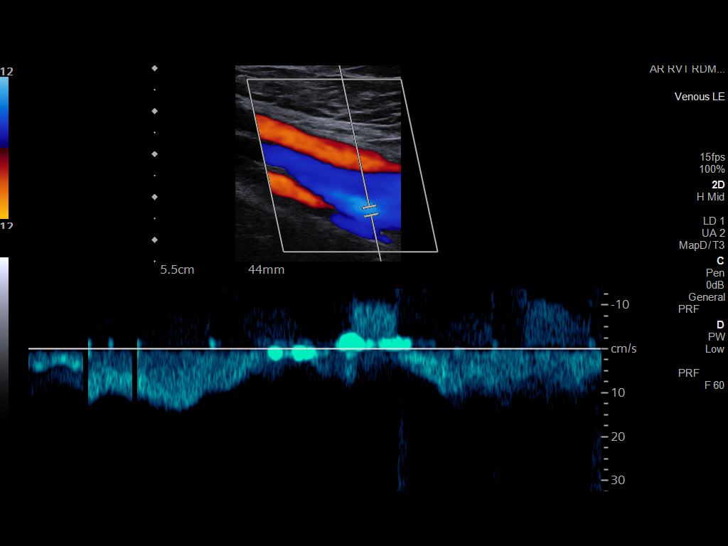
[im 19/36]
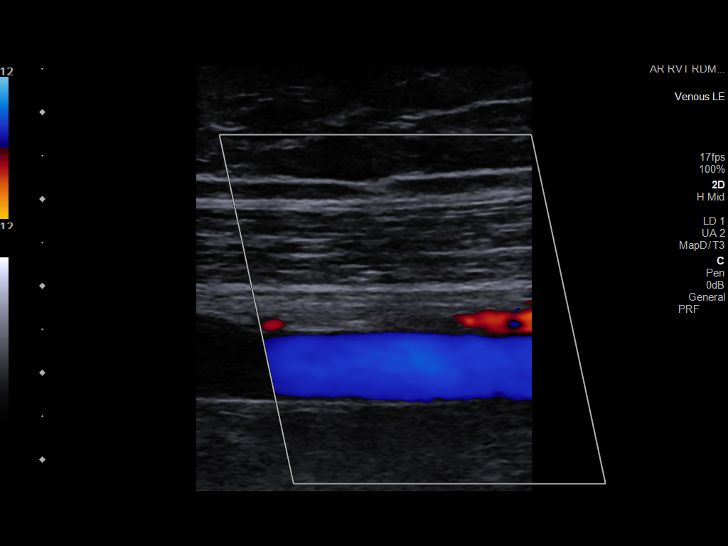
[im 20/36]
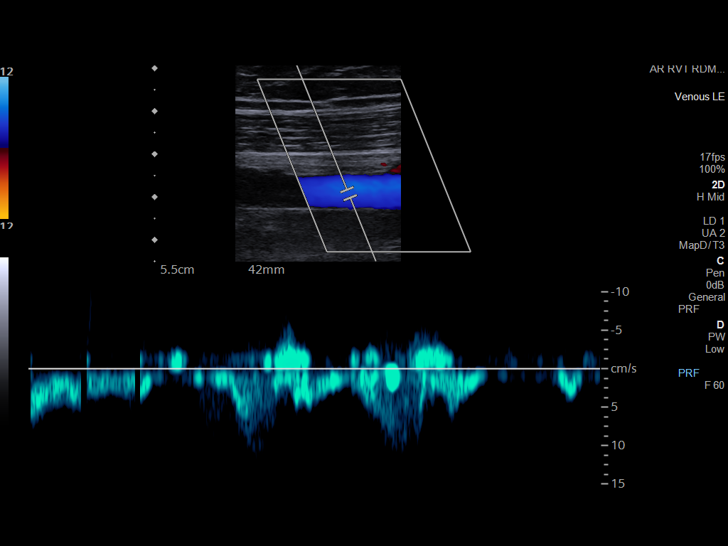
[im 23/36]
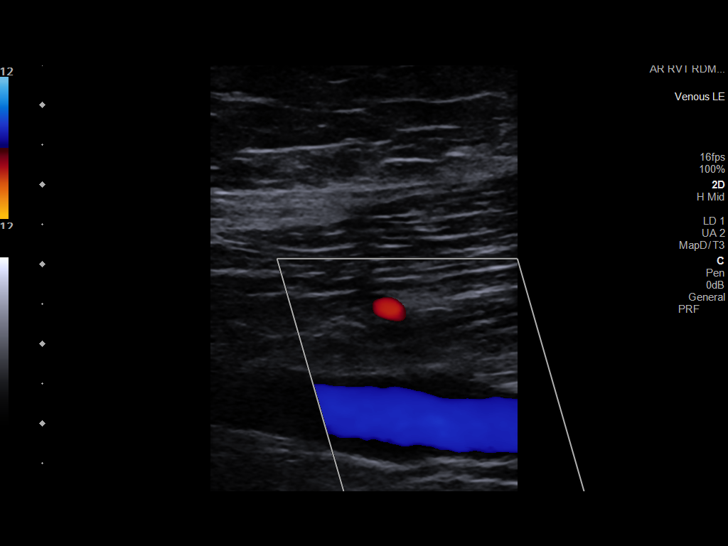
[im 26/36]
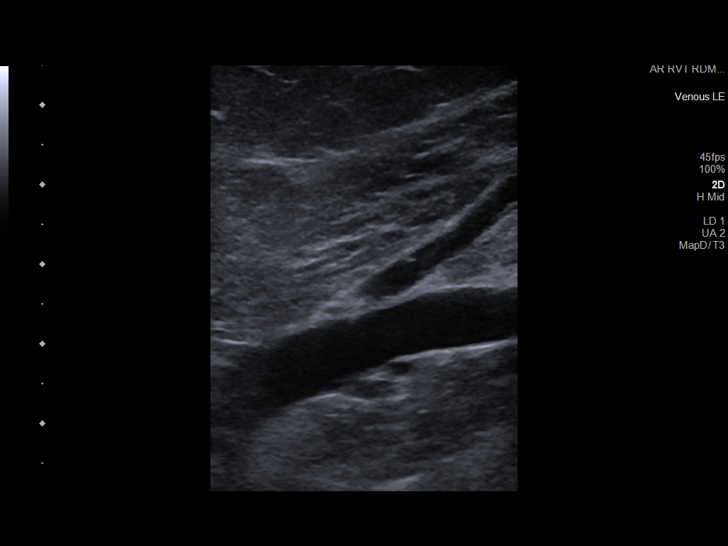
[im 29/36]
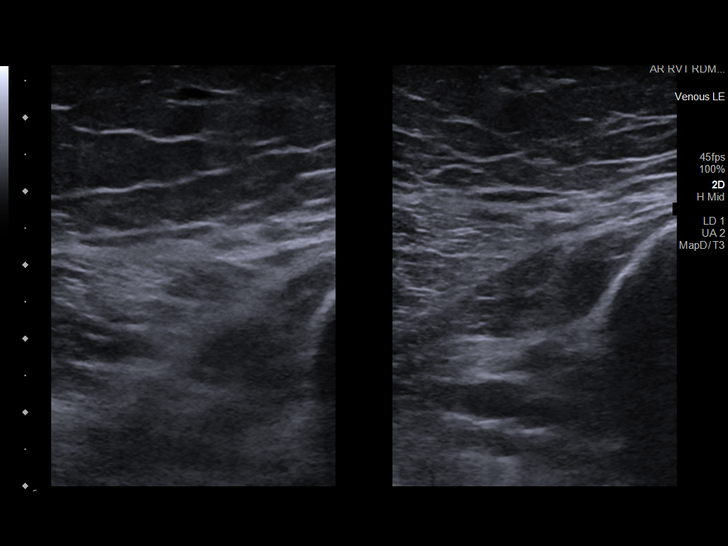
[im 32/36]
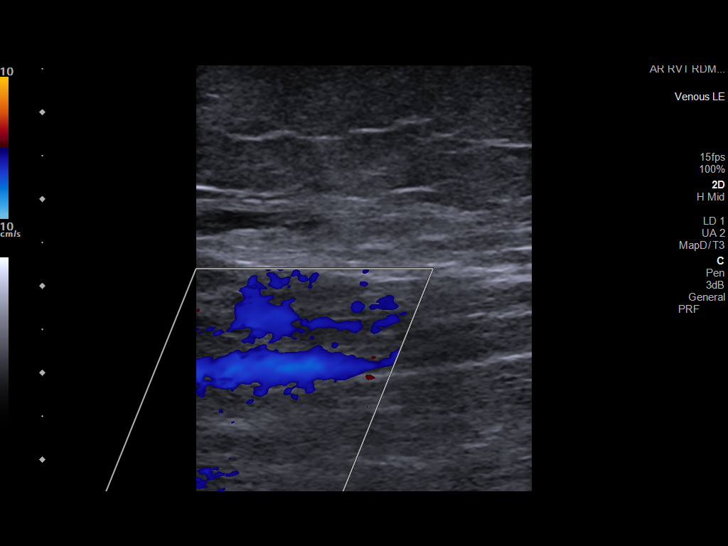
[im 36/36]
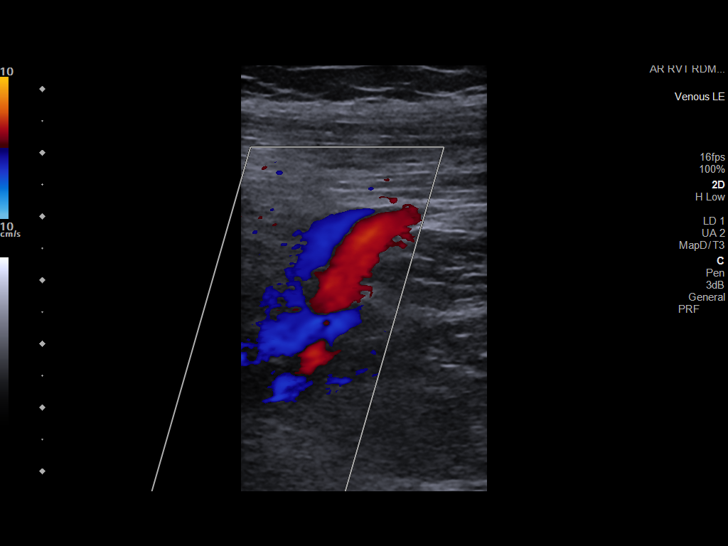

[13 of 24 positions shown; findings below may reference images not displayed]

FINDINGS: Contralateral Common Femoral Vein: Respiratory phasicity is normal
and symmetric with the symptomatic side. No evidence of thrombus.
Normal compressibility.

Common Femoral Vein: No evidence of thrombus. Normal
compressibility, respiratory phasicity and response to augmentation.

Saphenofemoral Junction: No evidence of thrombus. Normal
compressibility and flow on color Doppler imaging.

Profunda Femoral Vein: No evidence of thrombus. Normal
compressibility and flow on color Doppler imaging.

Femoral Vein: No evidence of thrombus. Normal compressibility,
respiratory phasicity and response to augmentation.

Popliteal Vein: No evidence of thrombus. Normal compressibility,
respiratory phasicity and response to augmentation.

Calf Veins: No evidence of thrombus. Normal compressibility and flow
on color Doppler imaging.

Superficial Great Saphenous Vein: No evidence of thrombus. Normal
compressibility.

Venous Reflux:  None.

Other Findings: No evidence of superficial thrombophlebitis or
abnormal fluid collection.
IMPRESSION: No evidence of left lower extremity deep venous thrombosis.

## 2023-01-02 ENCOUNTER — Emergency Department (HOSPITAL_BASED_OUTPATIENT_CLINIC_OR_DEPARTMENT_OTHER): Payer: Medicaid Other

## 2023-01-02 ENCOUNTER — Encounter (HOSPITAL_BASED_OUTPATIENT_CLINIC_OR_DEPARTMENT_OTHER): Payer: Self-pay

## 2023-01-02 ENCOUNTER — Emergency Department (HOSPITAL_BASED_OUTPATIENT_CLINIC_OR_DEPARTMENT_OTHER)
Admission: EM | Admit: 2023-01-02 | Discharge: 2023-01-02 | Disposition: A | Discharge status: Home / Self Care | Payer: Medicaid Other | Attending: Emergency Medicine | Admitting: Emergency Medicine

## 2023-01-02 ENCOUNTER — Other Ambulatory Visit: Payer: Self-pay

## 2023-01-02 DIAGNOSIS — G8929 Other chronic pain: Secondary | ICD-10-CM | POA: Insufficient documentation

## 2023-01-02 DIAGNOSIS — R600 Localized edema: Secondary | ICD-10-CM | POA: Insufficient documentation

## 2023-01-02 DIAGNOSIS — R1013 Epigastric pain: Secondary | ICD-10-CM | POA: Insufficient documentation

## 2023-01-02 LAB — CBC
HCT: 36.4 % (ref 36.0–46.0)
Hemoglobin: 11.5 g/dL — ABNORMAL LOW (ref 12.0–15.0)
MCH: 27.2 pg (ref 26.0–34.0)
MCHC: 31.6 g/dL (ref 30.0–36.0)
MCV: 86.1 fL (ref 80.0–100.0)
Platelets: 269 10*3/uL (ref 150–400)
RBC: 4.23 MIL/uL (ref 3.87–5.11)
RDW: 13.8 % (ref 11.5–15.5)
WBC: 9.9 10*3/uL (ref 4.0–10.5)
nRBC: 0 % (ref 0.0–0.2)

## 2023-01-02 LAB — HEPATIC FUNCTION PANEL
ALT: 19 U/L (ref 0–44)
AST: 20 U/L (ref 15–41)
Albumin: 3.2 g/dL — ABNORMAL LOW (ref 3.5–5.0)
Alkaline Phosphatase: 97 U/L (ref 38–126)
Bilirubin, Direct: <0.1 mg/dL (ref 0.0–0.2)
Total Bilirubin: <0.1 mg/dL — ABNORMAL LOW (ref 0.3–1.2)
Total Protein: 7.3 g/dL (ref 6.5–8.1)

## 2023-01-02 LAB — PREGNANCY, URINE: Preg Test, Ur: NEGATIVE

## 2023-01-02 LAB — BASIC METABOLIC PANEL
Anion gap: 10 (ref 5–15)
BUN: 10 mg/dL (ref 6–20)
CO2: 24 mmol/L (ref 22–32)
Calcium: 8.7 mg/dL — ABNORMAL LOW (ref 8.9–10.3)
Chloride: 104 mmol/L (ref 98–111)
Creatinine, Ser: 0.83 mg/dL (ref 0.44–1.00)
GFR, Estimated: >60 mL/min (ref >60)
Glucose, Bld: 103 mg/dL — ABNORMAL HIGH (ref 70–99)
Potassium: 3.7 mmol/L (ref 3.5–5.1)
Sodium: 138 mmol/L (ref 135–145)

## 2023-01-02 LAB — RAPID URINE DRUG SCREEN, HOSP PERFORMED
Amphetamines: NOT DETECTED
Barbiturates: NOT DETECTED
Benzodiazepines: NOT DETECTED
Cocaine: POSITIVE — AB
Opiates: NOT DETECTED
Tetrahydrocannabinol: NOT DETECTED

## 2023-01-02 LAB — LIPASE, BLOOD: Lipase: 25 U/L (ref 11–51)

## 2023-01-02 LAB — BRAIN NATRIURETIC PEPTIDE: B Natriuretic Peptide: 113.2 pg/mL — ABNORMAL HIGH (ref 0.0–100.0)

## 2023-01-02 LAB — TROPONIN I (HIGH SENSITIVITY): Troponin I (High Sensitivity): 3 ng/L (ref <18)

## 2023-01-02 NOTE — ED Triage Notes (Addendum)
Pt c/o epigastric pain Chest pain  N/V & palpitations States she uses street fentanyl (snorts it) uses 6x daily Last use was 11pm

## 2023-01-02 NOTE — ED Provider Notes (Signed)
[place] [facility] AT [facility]  Provider Note  CSN: [id number] Arrival date & time: 01/02/23 6948  History Chief Complaint  Patient presents with   Chest Pain   Abdominal Pain    Erica Klein is a 42 y.o. female with history of chronic epigastric pain, polysubstance abuse, gets majority of her care at her home in [place], has been visiting friends here for the last week and reports her epigastric pain has been worse, radiating into her chest, associated with bloating in abdomen, SOB and swelling in her legs. No fevers. She is snorting fentanyl and cocaine daily including just prior to coming to the ED. She was previously on methadone but reports they took her off of it a few months ago. She is scheduled for EGD in Nov.    Home Medications Prior to Admission medications   Medication Sig Start Date End Date Taking? Authorizing Provider  albuterol (VENTOLIN HFA) 108 (90 Base) MCG/ACT inhaler Inhale 2 puffs into the lungs every 4 (four) hours as needed for wheezing or shortness of breath.  04/08/19   [provider]  clonazePAM (KLONOPIN) 0.5 MG tablet Take 0.5 mg by mouth daily as needed. 07/23/19   [provider]  gabapentin (NEURONTIN) 300 MG capsule Take 300 mg by mouth 3 (three) times daily. 09/28/19   [provider]  nitrofurantoin, macrocrystal-monohydrate, (MACROBID) 100 MG capsule Take 1 capsule (100 mg total) by mouth 2 (two) times daily. 04/16/20   Liberty Handy, PA-C  nystatin (MYCOSTATIN) 100000 UNIT/ML suspension Take 1 mL by mouth 2 (two) times daily. 10/09/19   [provider]  ondansetron (ZOFRAN) 4 MG tablet Take 1 tablet (4 mg total) by mouth every 8 (eight) hours as needed for nausea or vomiting. 10/14/19   Terrilee Files, MD  pantoprazole (PROTONIX) 20 MG tablet Take 1 tablet (20 mg total) by mouth daily. 10/14/19   Terrilee Files, MD  phenazopyridine (PYRIDIUM) 200 MG tablet Take 1 tablet (200 mg  total) by mouth 3 (three) times daily. 04/16/20   Liberty Handy, PA-C  VRAYLAR capsule Take 1.5 mg by mouth daily. 09/26/19   [provider]     Allergies    Keflex [cephalexin] and Penicillins   Review of Systems   Review of Systems Please see HPI for pertinent positives and negatives  Physical Exam BP (!) 165/108 (BP Location: Left Arm)   Pulse 91   Temp 98 F (36.7 C)   Resp 20   Ht 5\' 10"  (1.778 m)   Wt (!) 142.9 kg   SpO2 100%   BMI 45.20 kg/m   Physical Exam Vitals and nursing note reviewed.  Constitutional:      Appearance: Normal appearance.  HENT:     Head: Normocephalic and atraumatic.     Nose: Nose normal.     Mouth/Throat:     Mouth: Mucous membranes are moist.  Eyes:     Extraocular Movements: Extraocular movements intact.     Conjunctiva/sclera: Conjunctivae normal.  Cardiovascular:     Rate and Rhythm: Normal rate.  Pulmonary:     Effort: Pulmonary effort is normal.     Breath sounds: Normal breath sounds.  Abdominal:     General: Abdomen is flat.     Palpations: Abdomen is soft.     Tenderness: There is abdominal tenderness in the epigastric area. There is no guarding. Negative signs include Murphy's sign and McBurney's sign.  Musculoskeletal:  General: No swelling. Normal range of motion.     Cervical back: Neck supple.     Right lower leg: Edema (1+) present.     Left lower leg: Edema (1+) present.  Skin:    General: Skin is warm and dry.  Neurological:     General: No focal deficit present.     Mental Status: She is alert.  Psychiatric:        Mood and Affect: Mood normal.     ED Results / Procedures / Treatments   EKG EKG Interpretation Date/Time:  Wednesday January 02 2023 01:09:42 EDT Ventricular Rate:  86 PR Interval:  160 QRS Duration:  78 QT Interval:  358 QTC Calculation: 428 R Axis:   47  Text Interpretation: Normal sinus rhythm Normal ECG When compared with ECG of 11-Jul-2020 18:53, PREVIOUS ECG  IS PRESENT No significant change since last tracing Confirmed by Susy Frizzle [phone]) on 01/02/2023 1:36:03 AM  Procedures Procedures  Medications Ordered in the ED Medications - No data to display  Initial Impression and Plan  Patient here with exacerbation of her chronic pain but also having SOB and leg swelling. She has an overall benign exam and reassuring vitals. Labs ordered in triage show UDS positive for cocaine. CBC is unremarkable. HCG is neg. I personally viewed the images from radiology studies and agree with radiologist interpretation: CXR is clear. There is a hiatal hernia but no pulm edema. Awaiting remainder of labs. Patient cautioned not to drive after using sedating drugs.   ED Course   Clinical Course as of 01/02/23 0258  Wed Jan 02, 2023  0158 BMP is normal, add LFTs to complete epigastric pain workup. Lipase is normal.  [CS]  0205 Trop is normal. Given duration of symptoms, repeat is not necessary to rule out AMI.  [CS]  0220 LFT are normal. BNP not significantly elevated.  [CS]  0257 Labs are unremarkable. No signs of life threatening or acute cause of her pain or swelling. She is resting comfortably in bed. Recommend she follow up with her PCP when she returns to [place]. RTED for any other concerns.  [CS]    Clinical Course User Index [CS] Pollyann Savoy, MD     MDM Rules/Calculators/A&P Medical Decision Making Given presenting complaint, I considered that admission might be necessary. After review of results from ED lab and/or imaging studies, admission to the hospital is not indicated at this time.    Problems Addressed: Chronic epigastric pain: chronic illness or injury with exacerbation, progression, or side effects of treatment Peripheral edema: undiagnosed new problem with uncertain prognosis  Amount and/or Complexity of Data Reviewed Labs: ordered. Decision-making details documented in ED Course. Radiology: ordered and independent  interpretation performed. Decision-making details documented in ED Course. ECG/medicine tests: ordered and independent interpretation performed. Decision-making details documented in ED Course.  Risk Decision regarding hospitalization.     Final Clinical Impression(s) / ED Diagnoses Final diagnoses:  Chronic epigastric pain  Peripheral edema    Rx / DC Orders ED Discharge Orders     None        Pollyann Savoy, MD 01/02/23 [phone]

## 2023-01-02 NOTE — ED Notes (Signed)
Patient transported to X-ray 

## 2023-12-25 ENCOUNTER — Other Ambulatory Visit: Payer: Self-pay

## 2023-12-25 ENCOUNTER — Emergency Department (HOSPITAL_COMMUNITY)
Admission: EM | Admit: 2023-12-25 | Discharge: 2023-12-26 | Disposition: A | Discharge status: Home / Self Care | Attending: Emergency Medicine | Admitting: Emergency Medicine

## 2023-12-25 ENCOUNTER — Emergency Department (HOSPITAL_COMMUNITY)

## 2023-12-25 ENCOUNTER — Encounter (HOSPITAL_COMMUNITY): Payer: Self-pay

## 2023-12-25 DIAGNOSIS — I517 Cardiomegaly: Secondary | ICD-10-CM | POA: Diagnosis not present

## 2023-12-25 DIAGNOSIS — T50904A Poisoning by unspecified drugs, medicaments and biological substances, undetermined, initial encounter: Secondary | ICD-10-CM | POA: Insufficient documentation

## 2023-12-25 DIAGNOSIS — D72819 Decreased white blood cell count, unspecified: Secondary | ICD-10-CM | POA: Diagnosis not present

## 2023-12-25 DIAGNOSIS — R0689 Other abnormalities of breathing: Secondary | ICD-10-CM | POA: Insufficient documentation

## 2023-12-25 DIAGNOSIS — X58XXXA Exposure to other specified factors, initial encounter: Secondary | ICD-10-CM | POA: Insufficient documentation

## 2023-12-25 DIAGNOSIS — K449 Diaphragmatic hernia without obstruction or gangrene: Secondary | ICD-10-CM | POA: Diagnosis not present

## 2023-12-25 LAB — COMPREHENSIVE METABOLIC PANEL WITH GFR
ALT: 19 U/L (ref 0–44)
AST: 42 U/L — ABNORMAL HIGH (ref 15–41)
Albumin: 3.3 g/dL — ABNORMAL LOW (ref 3.5–5.0)
Alkaline Phosphatase: 93 U/L (ref 38–126)
Anion gap: 11 (ref 5–15)
BUN: 13 mg/dL (ref 6–20)
CO2: 18 mmol/L — ABNORMAL LOW (ref 22–32)
Calcium: 8.6 mg/dL — ABNORMAL LOW (ref 8.9–10.3)
Chloride: 108 mmol/L (ref 98–111)
Creatinine, Ser: 0.89 mg/dL (ref 0.44–1.00)
GFR, Estimated: >60 mL/min (ref >60)
Glucose, Bld: 133 mg/dL — ABNORMAL HIGH (ref 70–99)
Potassium: 4 mmol/L (ref 3.5–5.1)
Sodium: 137 mmol/L (ref 135–145)
Total Bilirubin: 1.1 mg/dL (ref 0.0–1.2)
Total Protein: 7.2 g/dL (ref 6.5–8.1)

## 2023-12-25 LAB — I-STAT VENOUS BLOOD GAS, ED
Acid-Base Excess: 0 mmol/L (ref 0.0–2.0)
Bicarbonate: 20.6 mmol/L (ref 20.0–28.0)
Calcium, Ion: 1.03 mmol/L — ABNORMAL LOW (ref 1.15–1.40)
HCT: 37 % (ref 36.0–46.0)
Hemoglobin: 12.6 g/dL (ref 12.0–15.0)
O2 Saturation: 99 %
Potassium: 3.4 mmol/L — ABNORMAL LOW (ref 3.5–5.1)
Sodium: 141 mmol/L (ref 135–145)
TCO2: 21 mmol/L — ABNORMAL LOW (ref 22–32)
pCO2, Ven: 23.5 mmHg — ABNORMAL LOW (ref 44–60)
pH, Ven: 7.55 — ABNORMAL HIGH (ref 7.25–7.43)
pO2, Ven: 139 mmHg — ABNORMAL HIGH (ref 32–45)

## 2023-12-25 LAB — CBC
HCT: 39.3 % (ref 36.0–46.0)
Hemoglobin: 12.3 g/dL (ref 12.0–15.0)
MCH: 26.5 pg (ref 26.0–34.0)
MCHC: 31.3 g/dL (ref 30.0–36.0)
MCV: 84.5 fL (ref 80.0–100.0)
Platelets: 280 K/uL (ref 150–400)
RBC: 4.65 MIL/uL (ref 3.87–5.11)
RDW: 14.5 % (ref 11.5–15.5)
WBC: 13.5 K/uL — ABNORMAL HIGH (ref 4.0–10.5)
nRBC: 0 % (ref 0.0–0.2)

## 2023-12-25 LAB — I-STAT CHEM 8, ED
BUN: 12 mg/dL (ref 6–20)
Calcium, Ion: 1.02 mmol/L — ABNORMAL LOW (ref 1.15–1.40)
Chloride: 108 mmol/L (ref 98–111)
Creatinine, Ser: 0.8 mg/dL (ref 0.44–1.00)
Glucose, Bld: 141 mg/dL — ABNORMAL HIGH (ref 70–99)
HCT: 37 % (ref 36.0–46.0)
Hemoglobin: 12.6 g/dL (ref 12.0–15.0)
Potassium: 3.3 mmol/L — ABNORMAL LOW (ref 3.5–5.1)
Sodium: 141 mmol/L (ref 135–145)
TCO2: 21 mmol/L — ABNORMAL LOW (ref 22–32)

## 2023-12-25 LAB — HCG, SERUM, QUALITATIVE: Preg, Serum: NEGATIVE

## 2023-12-25 NOTE — ED Triage Notes (Signed)
 Pt arrives with EMS after being found on side of road unresponsive. First crew on scene gave .5mg  intranasal narcan  and pt repsonded slightly. Pt then started to go unresponsive again with low resp, EMS administered another .5mg  narcan  intranasal and pt is now moving and reports it hurts, it hurts. I took fentanyl. Pt reports getting fentanyl from a friend.

## 2023-12-25 NOTE — ED Notes (Signed)
 Pt reports feeling weird after taking fentanyl and xylozine so she pulled over on side of road. Intermittently takes fentanyl, denies any ETOH. Able to tell name, city, and some of situation.

## 2023-12-25 NOTE — ED Provider Notes (Incomplete)
  EMERGENCY DEPARTMENT AT [facility] Provider Note   CSN: [id number] Arrival date & time: 12/25/23  2225     Patient presents with: Drug Overdose   Erica Klein is a 43 y.o. female with history of anxiety, neuropathy, bipolar 1 disorder.  Patient presents to ED for drug overdose.  Patient was apparently found down by EMS on the side of the road.  Patient reports that she is fentanyl tonight but cannot tell me how much.  She arrives on oxygen.  Patient was apparently given 1 mg total of intranasal Narcan .  Patient on arrival reports it hurts, it hurts..  She states she received fentanyl tonight from a friend.  She denies doing fentanyl often.  She states she typically does xylazine.  She denies any alcohol use tonight.  Denies any other drug use.  She arrives complaining of pain all over.  She is disoriented.   Drug Overdose       Prior to Admission medications   Medication Sig Start Date End Date Taking? Authorizing Provider  albuterol (VENTOLIN HFA) 108 (90 Base) MCG/ACT inhaler Inhale 2 puffs into the lungs every 4 (four) hours as needed for wheezing or shortness of breath.  04/08/19   [provider]  clonazePAM (KLONOPIN) 0.5 MG tablet Take 0.5 mg by mouth daily as needed. 07/23/19   [provider]  gabapentin  (NEURONTIN ) 300 MG capsule Take 300 mg by mouth 3 (three) times daily. 09/28/19   [provider]  nitrofurantoin , macrocrystal-monohydrate, (MACROBID ) 100 MG capsule Take 1 capsule (100 mg total) by mouth 2 (two) times daily. 04/16/20   Gibbons, Claudia J, PA-C  nystatin (MYCOSTATIN) 100000 UNIT/ML suspension Take 1 mL by mouth 2 (two) times daily. 10/09/19   [provider]  ondansetron  (ZOFRAN ) 4 MG tablet Take 1 tablet (4 mg total) by mouth every 8 (eight) hours as needed for nausea or vomiting. 10/14/19   Towana Ozell BROCKS, MD  pantoprazole  (PROTONIX ) 20 MG tablet Take 1 tablet (20 mg total) by mouth daily. 10/14/19    Towana Ozell BROCKS, MD  phenazopyridine  (PYRIDIUM ) 200 MG tablet Take 1 tablet (200 mg total) by mouth 3 (three) times daily. 04/16/20   Gibbons, Claudia J, PA-C  VRAYLAR capsule Take 1.5 mg by mouth daily. 09/26/19   [provider]    Allergies: Keflex [cephalexin] and Penicillins    Review of Systems  Updated Vital Signs BP (!) 157/73 (BP Location: Left Arm)   Pulse 62   Resp 20   SpO2 97%   Physical Exam  (all labs ordered are listed, but only abnormal results are displayed) Labs Reviewed  CBC  COMPREHENSIVE METABOLIC PANEL WITH GFR  RAPID URINE DRUG SCREEN, HOSP PERFORMED  URINALYSIS, ROUTINE W REFLEX MICROSCOPIC  HCG, SERUM, QUALITATIVE  ETHANOL  SALICYLATE LEVEL  ACETAMINOPHEN  LEVEL  I-STAT CHEM 8, ED  I-STAT VENOUS BLOOD GAS, ED    EKG: None  Radiology: No results found.  {Document cardiac monitor, telemetry assessment procedure when appropriate:32947} Procedures   Medications Ordered in the ED - No data to display    {Click here for ABCD2, HEART and other calculators REFRESH Note before signing:1}                              Medical Decision Making Amount and/or Complexity of Data Reviewed Labs: ordered. Radiology: ordered. ECG/medicine tests: ordered.   ***  {Document critical care time when appropriate  Document review of labs and clinical decision tools ie CHADS2VASC2, etc  Document your independent review of radiology images and any outside records  Document your discussion with family members, caretakers and with consultants  Document social determinants of health affecting pt's care  Document your decision making why or why not admission, treatments were needed:32947:::1}   Final diagnoses:  None    ED Discharge Orders     None

## 2023-12-25 NOTE — ED Provider Notes (Signed)
 [place] EMERGENCY DEPARTMENT AT [facility] Provider Note   CSN: [id number] Arrival date & time: 12/25/23  2225     Patient presents with: Drug Overdose   Erica Klein is a 43 y.o. female with history of anxiety, neuropathy, bipolar 1 disorder.  Patient presents to ED for drug overdose.  Patient was apparently found down by EMS on the side of the road.  Patient reports that she is fentanyl tonight but cannot tell me how much.  She arrives on oxygen.  Patient was apparently given 1 mg total of intranasal Narcan .  Patient on arrival reports it hurts, it hurts..  She states she received fentanyl tonight from a friend.  She denies doing fentanyl often.  She states she typically does xylazine.  She denies any alcohol use tonight.  Denies any other drug use.  She arrives complaining of pain all over.  She is disoriented.   Drug Overdose       Prior to Admission medications   Medication Sig Start Date End Date Taking? Authorizing Provider  naloxone  (NARCAN ) nasal spray 4 mg/0.1 mL Utilize as directed when there is concern of opioid overdose. 12/26/23  Yes Ruthell Lonni FALCON, PA-C  albuterol (VENTOLIN HFA) 108 (90 Base) MCG/ACT inhaler Inhale 2 puffs into the lungs every 4 (four) hours as needed for wheezing or shortness of breath.  04/08/19   [provider]  clonazePAM (KLONOPIN) 0.5 MG tablet Take 0.5 mg by mouth daily as needed. 07/23/19   [provider]  gabapentin  (NEURONTIN ) 300 MG capsule Take 300 mg by mouth 3 (three) times daily. 09/28/19   [provider]  nitrofurantoin , macrocrystal-monohydrate, (MACROBID ) 100 MG capsule Take 1 capsule (100 mg total) by mouth 2 (two) times daily. 04/16/20   Gibbons, Claudia J, PA-C  nystatin (MYCOSTATIN) 100000 UNIT/ML suspension Take 1 mL by mouth 2 (two) times daily. 10/09/19   [provider]  ondansetron  (ZOFRAN ) 4 MG tablet Take 1 tablet (4 mg total) by mouth every 8 (eight) hours as needed for  nausea or vomiting. 10/14/19   Towana Ozell BROCKS, MD  pantoprazole  (PROTONIX ) 20 MG tablet Take 1 tablet (20 mg total) by mouth daily. 10/14/19   Towana Ozell BROCKS, MD  phenazopyridine  (PYRIDIUM ) 200 MG tablet Take 1 tablet (200 mg total) by mouth 3 (three) times daily. 04/16/20   Gibbons, Claudia J, PA-C  VRAYLAR capsule Take 1.5 mg by mouth daily. 09/26/19   [provider]    Allergies: Keflex [cephalexin] and Penicillins    Review of Systems  Unable to perform ROS: Acuity of condition (Level 5 caveat)  All other systems reviewed and are negative.   Updated Vital Signs BP (!) 143/90   Pulse 83   Temp 98.3 F (36.8 C) (Oral)   Resp 18   Ht 5' 10 (1.778 m)   Wt 136.1 kg   SpO2 93%   BMI 43.05 kg/m   Physical Exam Vitals and nursing note reviewed.  Constitutional:      General: She is not in acute distress.    Appearance: She is well-developed.  HENT:     Head: Normocephalic and atraumatic.  Eyes:     Conjunctiva/sclera: Conjunctivae normal.  Cardiovascular:     Rate and Rhythm: Normal rate and regular rhythm.     Heart sounds: No murmur heard. Pulmonary:     Effort: Pulmonary effort is normal. No respiratory distress.     Breath sounds: Normal breath sounds.  Abdominal:  Palpations: Abdomen is soft.     Tenderness: There is no abdominal tenderness.  Musculoskeletal:        General: No swelling.     Cervical back: Neck supple.  Skin:    General: Skin is warm and dry.     Capillary Refill: Capillary refill takes less than 2 seconds.  Neurological:     Mental Status: She is alert. She is disoriented.     Comments: Follows commands appropriately.  Equal strength throughout.  Patient is disoriented.  Psychiatric:        Mood and Affect: Mood normal.     (all labs ordered are listed, but only abnormal results are displayed) Labs Reviewed  CBC - Abnormal; Notable for the following components:      Result Value   WBC 13.5 (*)    All other components within  normal limits  COMPREHENSIVE METABOLIC PANEL WITH GFR - Abnormal; Notable for the following components:   CO2 18 (*)    Glucose, Bld 133 (*)    Calcium 8.6 (*)    Albumin 3.3 (*)    AST 42 (*)    All other components within normal limits  RAPID URINE DRUG SCREEN, HOSP PERFORMED - Abnormal; Notable for the following components:   Cocaine POSITIVE (*)    All other components within normal limits  URINALYSIS, ROUTINE W REFLEX MICROSCOPIC - Abnormal; Notable for the following components:   APPearance HAZY (*)    Hgb urine dipstick MODERATE (*)    Protein, ur 30 (*)    Leukocytes,Ua SMALL (*)    Bacteria, UA FEW (*)    All other components within normal limits  SALICYLATE LEVEL - Abnormal; Notable for the following components:   Salicylate Lvl <7.0 (*)    All other components within normal limits  ACETAMINOPHEN  LEVEL - Abnormal; Notable for the following components:   Acetaminophen  (Tylenol ), Serum <10 (*)    All other components within normal limits  I-STAT CHEM 8, ED - Abnormal; Notable for the following components:   Potassium 3.3 (*)    Glucose, Bld 141 (*)    Calcium, Ion 1.02 (*)    TCO2 21 (*)    All other components within normal limits  I-STAT VENOUS BLOOD GAS, ED - Abnormal; Notable for the following components:   pH, Ven 7.550 (*)    pCO2, Ven 23.5 (*)    pO2, Ven 139 (*)    TCO2 21 (*)    Potassium 3.4 (*)    Calcium, Ion 1.03 (*)    All other components within normal limits  HCG, SERUM, QUALITATIVE  ETHANOL  BRAIN NATRIURETIC PEPTIDE  TROPONIN I (HIGH SENSITIVITY)    EKG: EKG Interpretation Date/Time:  Wednesday December 25 2023 22:46:57 EDT Ventricular Rate:  66 PR Interval:    QRS Duration:  101 QT Interval:  421 QTC Calculation: 442 R Axis:   65  Text Interpretation: Sinus rhythm Premature atrial complexes Abnormal R-wave progression, early transition Confirmed by Griselda Norris [phone]) on 12/25/2023 10:51:51 PM  Radiology: [facility] Chest Portable 1  View Result Date: 12/26/2023 CLINICAL DATA:  Concern for pleural effusion from yesterday's film. EXAM: PORTABLE CHEST 1 VIEW COMPARISON:  Portable chest yesterday at 10:50 p.m., AP Lat chest 01/02/2023. FINDINGS: 4:48 a.m. the lungs are mildly emphysematous but clear. The sulci are sharp. No substantial pleural effusion is seen. There is a large hiatal hernia with stable mediastinal configuration. Mild cardiomegaly. Central vascular prominence is present, but no overt edema. Overall aeration  seems unchanged.  Thoracic cage is intact. IMPRESSION: 1. No substantial pleural effusion is seen. 2. Central vascular prominence without overt edema or infiltrate. 3. Large hiatal hernia. 4. Mild cardiomegaly. Electronically Signed   By: Francis Quam M.D.   On: 12/26/2023 05:09   DG Chest Portable 1 View Result Date: 12/25/2023 CLINICAL DATA:  aspiration concern EXAM: PORTABLE CHEST 1 VIEW COMPARISON:  Chest x-ray 01/02/2023. FINDINGS: Patient is rotated. The heart and mediastinal contours are poorly evaluated due to patient positioning. Limited evaluation of the lungs due to positioning. Question pleural effusions. No pneumothorax. No acute osseous abnormality. IMPRESSION: Limited evaluation of the lungs due to positioning. Question pleural effusions. Recommend repeat chest x-ray PA and lateral view further evaluation. Electronically Signed   By: Morgane  Naveau M.D.   On: 12/25/2023 23:04    Procedures   Medications Ordered in the ED  ondansetron  (ZOFRAN ) injection 4 mg (4 mg Intravenous Given 12/26/23 0438)  gabapentin  (NEURONTIN ) capsule 300 mg (300 mg Oral Given 12/26/23 0437)     Medical Decision Making Amount and/or Complexity of Data Reviewed Labs: ordered. Radiology: ordered. ECG/medicine tests: ordered.  Risk Prescription drug management.   43 year old female presents after being found down by bystander.  On exam, the patient is disoriented.  She is afebrile and nontachycardic.  Lung sounds  are clear bilaterally, no hypoxia.  Abdomen soft and compressible.  Neurological examination at baseline, patient follows commands appropriately however she is disoriented.  Patient received 1 mg of Narcan  with EMS in the field.  Patient responded well to this.  Patient came in to ED on nasal cannula however this was discontinued and patient oxygen saturation maintained at 97% room air.  Labs are collected which show a leukocytosis of 13.5, no anemia.  Metabolic panel gross unremarkable without electrolyte derangement.  Anion gap 11. Ethanol undetectable.  Acetaminophen , salicylate undetectable.  Chest x-ray obtained which shows possible pleural effusions however BNP not elevated.  Repeat chest x-ray obtained which does not show pleural effusions, does show a large hiatal hernia and cardiomegaly.  No acute issues noted.  Urinalysis shows small leukocytes and bacteria but patient denies dysuria.  EKG is nonischemic.  UDS positive for cocaine.  Patient was allowed to rest in department for 6-1/2 hours.  She showed no signs of repeat overdose during this time.  She ambulated through the department with steady gait.  Patient had a friend come and pick her up from the hospital and she is discharged in stable condition.   Final diagnoses:  Drug overdose of undetermined intent, initial encounter    ED Discharge Orders          Ordered    naloxone  (NARCAN ) nasal spray 4 mg/0.1 mL        12/26/23 0533               Ruthell Lonni FALCON, PA-C 12/26/23 0535    Griselda Norris, MD 12/26/23 2249

## 2023-12-26 ENCOUNTER — Emergency Department (HOSPITAL_COMMUNITY)

## 2023-12-26 LAB — RAPID URINE DRUG SCREEN, HOSP PERFORMED
Amphetamines: NOT DETECTED
Barbiturates: NOT DETECTED
Benzodiazepines: NOT DETECTED
Cocaine: POSITIVE — AB
Opiates: NOT DETECTED
Tetrahydrocannabinol: NOT DETECTED

## 2023-12-26 LAB — URINALYSIS, ROUTINE W REFLEX MICROSCOPIC
Bilirubin Urine: NEGATIVE
Glucose, UA: NEGATIVE mg/dL
Ketones, ur: NEGATIVE mg/dL
Nitrite: NEGATIVE
Protein, ur: 30 mg/dL — AB
Specific Gravity, Urine: 1.019 (ref 1.005–1.030)
pH: 6 (ref 5.0–8.0)

## 2023-12-26 LAB — SALICYLATE LEVEL: Salicylate Lvl: <7 mg/dL — ABNORMAL LOW (ref 7.0–30.0)

## 2023-12-26 LAB — TROPONIN I (HIGH SENSITIVITY): Troponin I (High Sensitivity): 4 ng/L

## 2023-12-26 LAB — BRAIN NATRIURETIC PEPTIDE: B Natriuretic Peptide: 80.2 pg/mL (ref 0.0–100.0)

## 2023-12-26 LAB — ETHANOL: Alcohol, Ethyl (B): <15 mg/dL

## 2023-12-26 LAB — ACETAMINOPHEN LEVEL: Acetaminophen (Tylenol), Serum: <10 ug/mL — ABNORMAL LOW (ref 10–30)

## 2023-12-26 MED ORDER — NALOXONE HCL 4 MG/0.1ML NA LIQD: NASAL | 1 refills | Status: AC

## 2023-12-26 MED ORDER — GABAPENTIN 300 MG PO CAPS
300.0000 mg | ORAL_CAPSULE | ORAL | Status: AC
  Administered 2023-12-26: 300 mg via ORAL
  Filled 2023-12-26: qty 1

## 2023-12-26 MED ORDER — ONDANSETRON HCL 4 MG/2ML IJ SOLN
4.0000 mg | Freq: Once | INTRAMUSCULAR | Status: AC
  Administered 2023-12-26: 4 mg via INTRAVENOUS
  Filled 2023-12-26: qty 2

## 2023-12-26 NOTE — Discharge Instructions (Addendum)
 It was a pleasure taking part in your care.  As we discussed, your work appears reassuring.  Please discontinue use of illicit drug such as fentanyl.  Please follow-up with your PCP.  Please return to the ED with new symptoms.  I am sending you home with a Narcan  prescription, please pick this up your earliest convenience.

## 2023-12-26 NOTE — ED Notes (Signed)
 Pt awake and asking for bedpan, reports still feeling shaky and nauseous. Wanting nausea meds. Urine sample sent to lab and PA aware. Pt asked about where her boyfriend was, educated that pt came in via EMS and has not had any visitors.
